# Patient Record
Sex: Male | Born: 1935 | Race: Black or African American | Hispanic: No | Marital: Married
Health system: Southern US, Community
[De-identification: ages and names within clinical notes are randomized; demographics above are authoritative.]

---

## 2003-04-05 ENCOUNTER — Emergency Department (HOSPITAL_COMMUNITY): Admission: EM | Admit: 2003-04-05 | Discharge: 2003-04-05 | Payer: Self-pay | Admitting: Emergency Medicine

## 2004-05-13 ENCOUNTER — Ambulatory Visit: Payer: Self-pay | Admitting: Cardiology

## 2004-05-13 ENCOUNTER — Inpatient Hospital Stay (HOSPITAL_COMMUNITY): Admission: EM | Admit: 2004-05-13 | Discharge: 2004-05-14 | Payer: Self-pay | Admitting: *Deleted

## 2004-05-13 ENCOUNTER — Ambulatory Visit: Payer: Self-pay | Admitting: Internal Medicine

## 2007-01-06 IMAGING — CT CT ANGIO CHEST
2 of 7 series · 16 of 30 positions shown · IV contrast (omnipaque)
Comparison: none

CLINICAL DATA: Chest pain and shortness of breath.
CHEST CT ANGIO WITH CONTRAST:
TECHNIQUE: Multidetector CT imaging of the chest was performed according to the protocol for detection of pulmonary embolism during IV bolus injection of 120 ml Omnipaque 300.  Coronal and sagittal plane reformatted images were also generated.  
Satisfactory opacification of the pulmonary arteries is seen, and there is no evidence of acute pulmonary embolism.  There is no evidence of thoracic aortic aneurysm or dissection.  
A low attenuation superior mediastinal mass is seen extending into the lower right neck likely originating from the thyroid gland and likely representing substernal extension of goiter.  This results in tracheal deviation to the left.  
There is no evidence of pleural or pericardial effusion.  Patchy pulmonary infiltrates are seen which are more severe in the lower lung zones.  There are also scattered small less than 1 cm pulmonary nodular densities in the right lung.  These findings may be due to pulmonary edema or an infectious or inflammatory process.

[Series 4: pe · axial · 0.68mm/px · z∈[-351,-98]mm · 14 of 473 slices shown]
[im 34/473  lung]
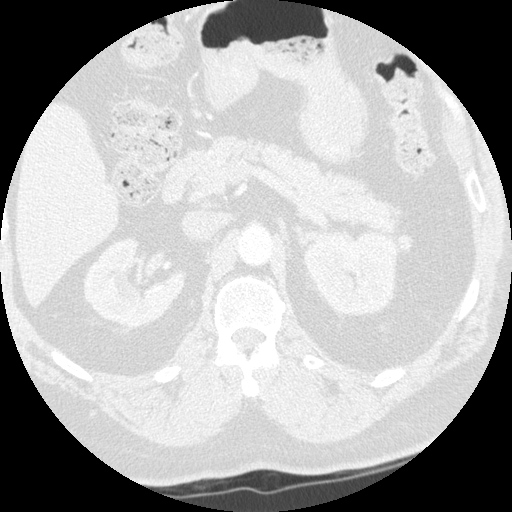
[im 68/473  mediastinal]
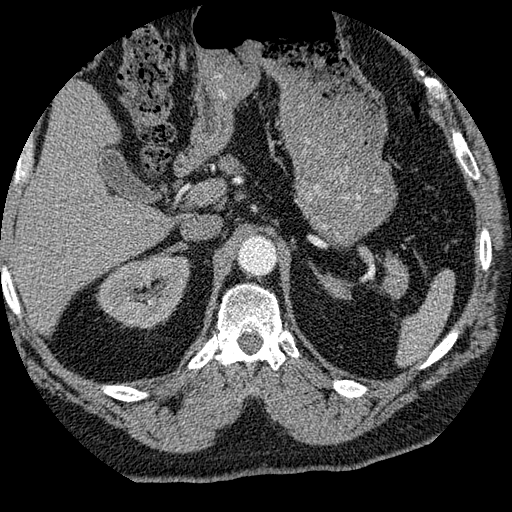
[im 102/473  lung]
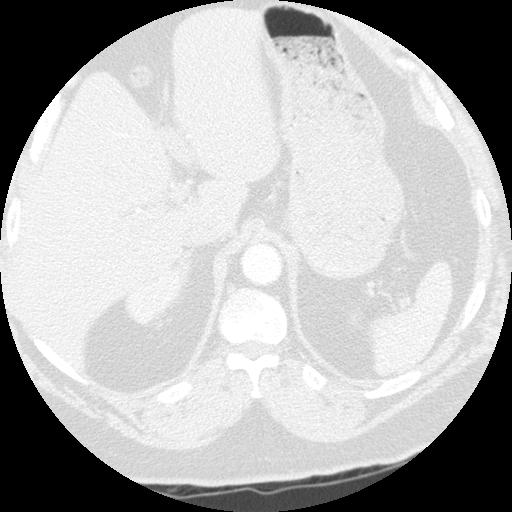
[im 135/473  mediastinal]
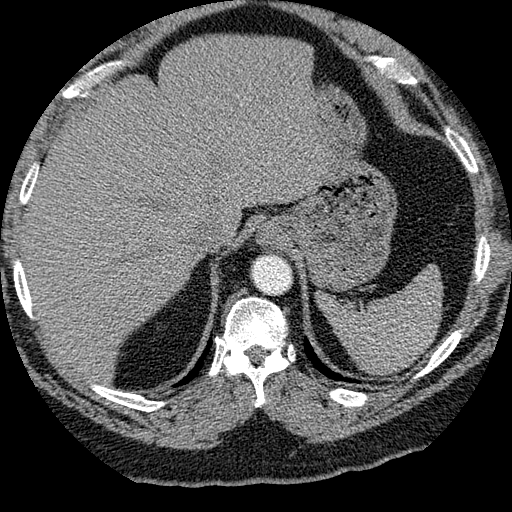
[im 169/473  lung]
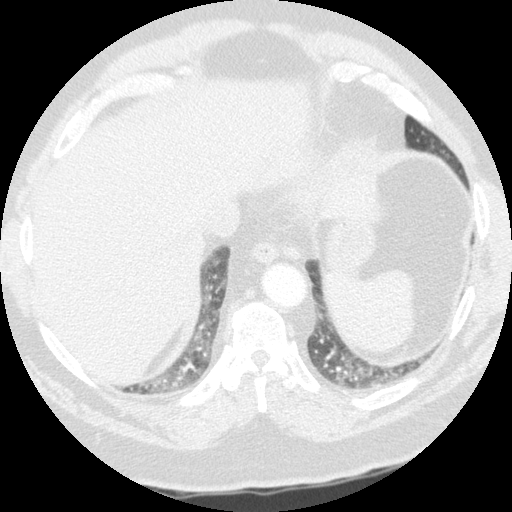
[im 203/473  mediastinal]
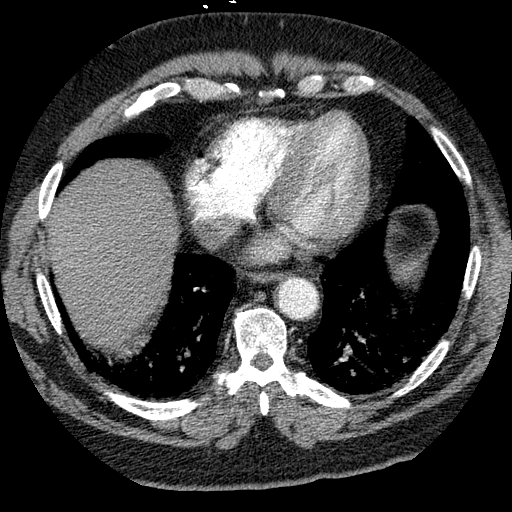
[im 237/473  lung]
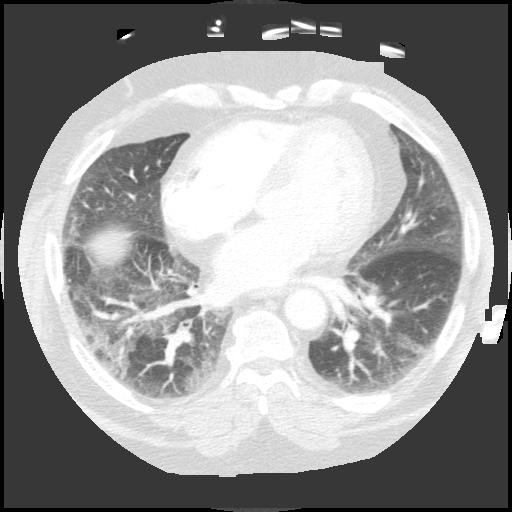
[im 246/473  mediastinal]
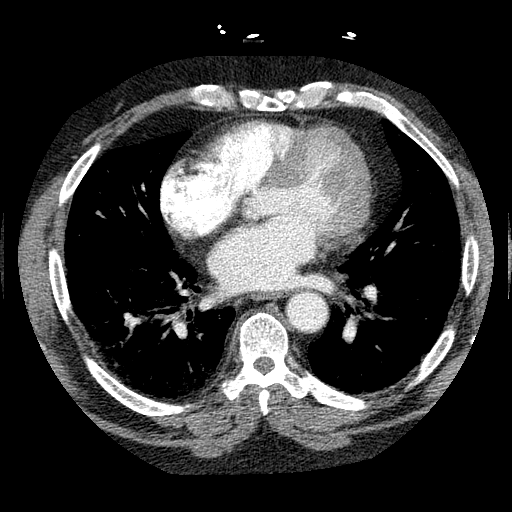
[im 270/473  lung]
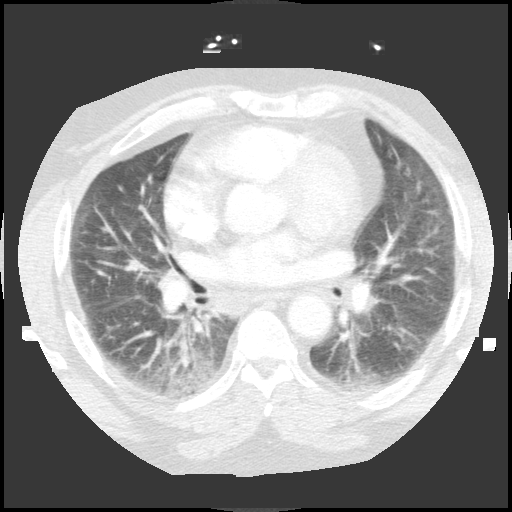
[im 304/473  mediastinal]
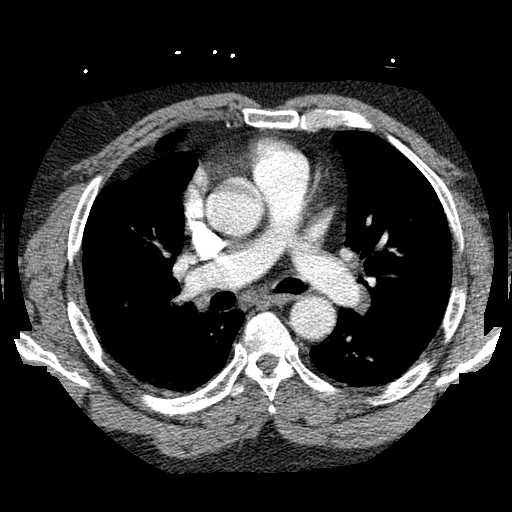
[im 338/473  lung]
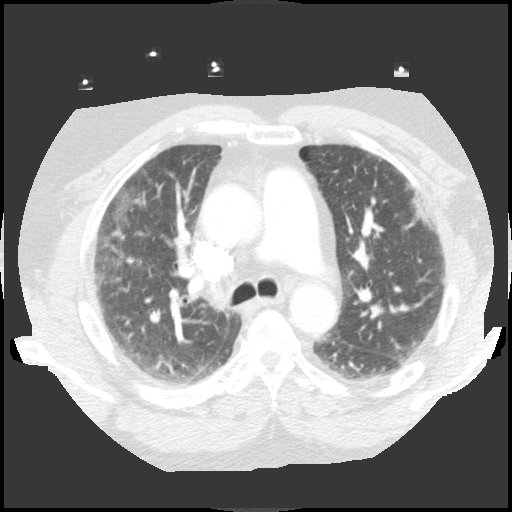
[im 371/473  mediastinal]
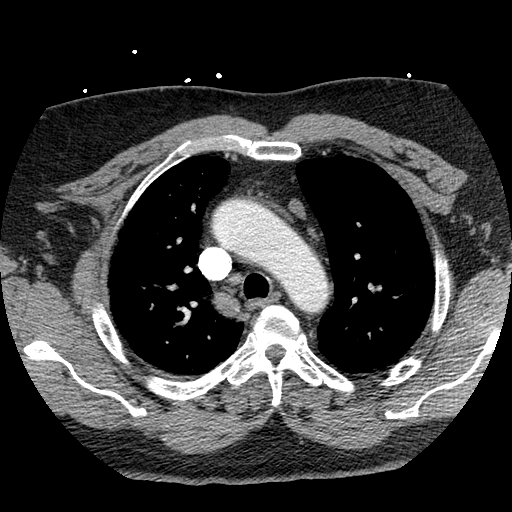
[im 405/473  lung]
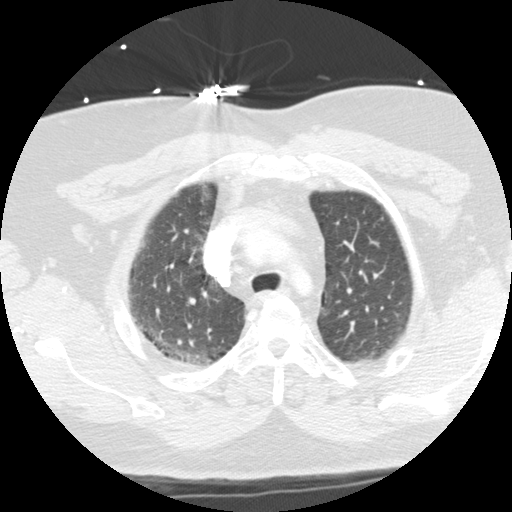
[im 439/473  mediastinal]
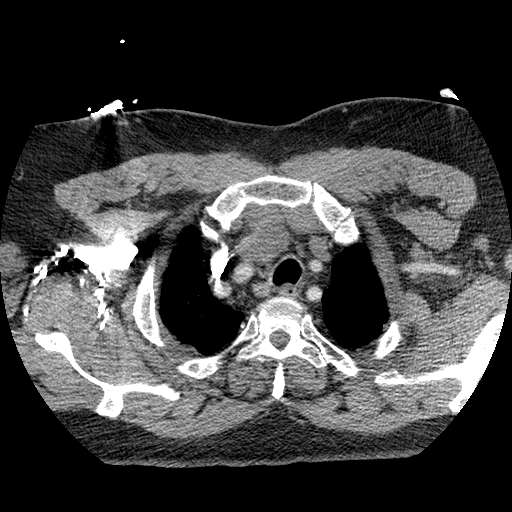

[Series 401: reformatted · coronal · 0.68mm/px · 2 of 106 slices shown]
[im 36/106  lung]
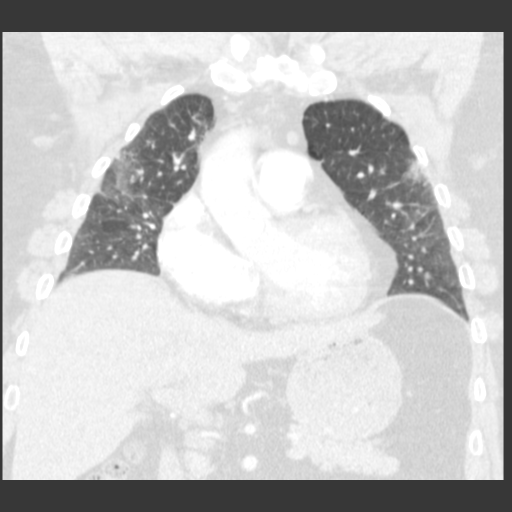
[im 71/106  lung]
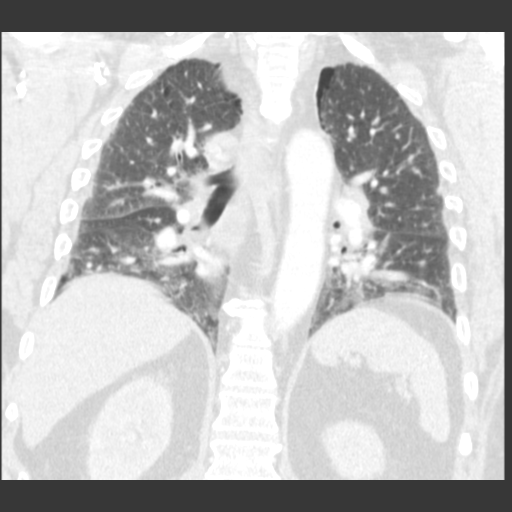

[16 of 30 positions shown; findings below may reference images not displayed]

IMPRESSION: 1.  No evidence of acute pulmonary embolism.
2.  Bilateral predominantly lower lung infiltrates and scattered tiny less than 1 cm right lung nodular densities.  These findings could be related to pulmonary edema or infectious or inflammatory etiologies; chest radiographic follow-up is recommended.
3.  Right superior mediastinal mass extending into the right neck, likely representing substernal extension of goiter.  Thyroid ultrasound is recommended for further evaluation.

## 2016-02-04 ENCOUNTER — Other Ambulatory Visit (HOSPITAL_COMMUNITY): Payer: Self-pay

## 2016-02-04 ENCOUNTER — Inpatient Hospital Stay
Admission: AD | Admit: 2016-02-04 | Discharge: 2016-02-28 | Disposition: A | Discharge status: Skilled Nursing Facility | Payer: Self-pay | Source: Other Acute Inpatient Hospital | Attending: Nephrology | Admitting: Nephrology

## 2016-02-04 DIAGNOSIS — J811 Chronic pulmonary edema: Secondary | ICD-10-CM

## 2016-02-04 DIAGNOSIS — J9690 Respiratory failure, unspecified, unspecified whether with hypoxia or hypercapnia: Secondary | ICD-10-CM

## 2016-02-04 LAB — COMPREHENSIVE METABOLIC PANEL
ALK PHOS: 77 U/L (ref 38–126)
ALT: 11 U/L — ABNORMAL LOW (ref 17–63)
ANION GAP: 9 (ref 5–15)
AST: 12 U/L — ABNORMAL LOW (ref 15–41)
Albumin: 3.4 g/dL — ABNORMAL LOW (ref 3.5–5.0)
BUN: 35 mg/dL — ABNORMAL HIGH (ref 6–20)
CALCIUM: 9.6 mg/dL (ref 8.9–10.3)
CO2: 25 mmol/L (ref 22–32)
Chloride: 103 mmol/L (ref 101–111)
Creatinine, Ser: 5.28 mg/dL — ABNORMAL HIGH (ref 0.61–1.24)
GFR, EST AFRICAN AMERICAN: 11 mL/min — AB (ref >60)
GFR, EST NON AFRICAN AMERICAN: 9 mL/min — AB (ref >60)
Glucose, Bld: 223 mg/dL — ABNORMAL HIGH (ref 65–99)
Potassium: 3.3 mmol/L — ABNORMAL LOW (ref 3.5–5.1)
SODIUM: 137 mmol/L (ref 135–145)
Total Bilirubin: 0.8 mg/dL (ref 0.3–1.2)
Total Protein: 6.4 g/dL — ABNORMAL LOW (ref 6.5–8.1)

## 2016-02-04 NOTE — Consult Note (Signed)
Date: 02/04/2016                  Patient Name:  Joshua HuxleyJames Macias  MRN: [id number]  DOB: [birth year]  Age / Sex: 80 y.o., male         PCP: No PCP Per Patient                 Service [facility]ing Consult[facility]                 Reason for Consult: ESRD            History of Present Illness: Patient is a 80 y.o. male with medical problems of and stated disease and hemodialysis for 3 years or so,chronic hypotension requiring midodrine, previous DVTs, history of GI bleed was admitted [facility] for shortness of breath., [facility] for ongoing management.   Patient is a lethargic and is able to provide only minimal information He states that his dialysis schedule Tuesday, Thursday, Saturday. Cannot name his dialysis unit or nephrologist He denies any shortness of breath According to discharge summary,he underwent a CT chest without contrast on November 4 which showed diffuse bilateral airspace disease likely due to pneumonia and also showed large stable pericardial effusion. Patient has known IJ DVTs. He could not get a PICC line due to this problem. He requires regular Midodrine for chronic hypotension.He has history of GI bleed therefore anticoagulation is avoided in this patient.Per chart, he had pulmonary edema due to missed hemodialysis He has chronic active fibrillation but no anticoagulation due to GI bleed.      Medications: Outpatient medications: No prescriptions prior to admission.    Current medications: No current facility-administered medications for this encounter.       Allergies: Allergies not on file    Past Medical History: No past medical history on file.   Past Surgical History: No past surgical history on file.   Family History: No family history on file.   Social History: Social History   Social History  . Marital status: Married    Spouse name:  N/A  . Number of children: N/A  . Years of education: N/A   Occupational History  . Not on file.   Social History Main Topics  . Smoking status: Not on file  . Smokeless tobacco: Not on file  . Alcohol use Not on file  . Drug use: Unknown  . Sexual activity: Not on file   Other Topics Concern  . Not on file   Social History Narrative  . No narrative on file     Review of Systems: limited due to patient lethargy Gen: no fevers, chills HEENT: denies vision or hearing problems CV: no chest pain or shortness of breath Resp: no cough, sputum GI:no nausea, vomiting GU : anuric MS: no complaint Derm:  no complaint Psych:no complaint Heme: no complaint Neuro: no complaint Endocrineno complaint  Vital Signs: There were no vitals taken for this visit.  No intake or output data in the 24 hours ending 02/04/16 1749  Weight trends: There were no vitals filed for this visit.  Physical Exam: General:  chronically ill-appearing gentleman, laying in the bed  HEENT Temporal muscle wasting, anicteric, moist oral mucous membranes  Neck:  supple  Lungs: Normal breathing effort, clear to auscultation  Heart:: irregular, atrial fibrillation  Abdomen: Soft, nontender, nondistended  Extremities:  no peripheral edema  Neurologic: Lethargic,  arousable, follows a few commands  Skin: No acute fracture  Access: IJ Permcath  Foley:        Lab results: Basic Metabolic Panel:  Recent Labs Lab 02/04/16 1510  NA 137  K 3.3*  CL 103  CO2 25  GLUCOSE 223*  BUN 35*  CREATININE 5.28*  CALCIUM 9.6    Liver Function Tests:  Recent Labs Lab 02/04/16 1510  AST 12*  ALT 11*  ALKPHOS 77  BILITOT 0.8  PROT 6.4*  ALBUMIN 3.4*   No results for input(s): LIPASE, AMYLASE in the last 168 hours. No results for input(s): AMMONIA in the last 168 hours.  CBC: No results for input(s): WBC, NEUTROABS, HGB, HCT, MCV, PLT in the last 168 hours.  Cardiac Enzymes: No results for  input(s): CKTOTAL, TROPONINI in the last 168 hours.  BNP: Invalid input(s): POCBNP  CBG: No results for input(s): GLUCAP in the last 168 hours.  Microbiology: No results found for this or any previous visit (from the past 720 hour(s)).   Coagulation Studies: No results for input(s): LABPROT, INR in the last 72 hours.  Urinalysis: No results for input(s): COLORURINE, LABSPEC, PHURINE, GLUCOSEU, HGBUR, BILIRUBINUR, KETONESUR, PROTEINUR, UROBILINOGEN, NITRITE, LEUKOCYTESUR in the last 72 hours.  Invalid input(s): APPERANCEUR      Imaging: Dg Chest Port 1 View  Result Date: 02/04/2016 CLINICAL DATA:  Pulmonary edema. EXAM: PORTABLE CHEST 1 VIEW COMPARISON:  May 13, 2004 FINDINGS: A left PermCath is identified in good position. Stable cardiomegaly. The hila and mediastinum are unchanged. Increased interstitial markings consistent with moderate pulmonary edema. No other acute abnormalities. IMPRESSION: Cardiomegaly and moderate pulmonary edema. Electronically Signed   By: Gerome Samavid  Williams III M.D   On: 02/04/2016 15:30      Assessment & Plan: Pt is a 80 y.o. AA  male with medical problems of and stated disease and hemodialysis for 3 years or so,chronic hypotension requiring midodrine, previous DVTs, history of GI bleed was admitted to [facility] for [facility]ness of [facility]., [facility] for ongoing management  1. End stage renal disease 2. Anemia of chronic kidney disease 3. Secondary hyperparathyroidism 4. Chronic hypotension 5. Pericardial effusion per CT scan from outside hospital  Plan: Hemodialysis TTS Schedule midodrine Aranesp if Hgb is low. Labs are pending at present minimal UF as patient appears clinically dry

## 2016-02-05 LAB — RENAL FUNCTION PANEL
Albumin: 3.3 g/dL — ABNORMAL LOW (ref 3.5–5.0)
Anion gap: 12 (ref 5–15)
BUN: 45 mg/dL — AB (ref 6–20)
CHLORIDE: 103 mmol/L (ref 101–111)
CO2: 24 mmol/L (ref 22–32)
CREATININE: 6.35 mg/dL — AB (ref 0.61–1.24)
Calcium: 9.7 mg/dL (ref 8.9–10.3)
GFR calc Af Amer: 9 mL/min — ABNORMAL LOW (ref >60)
GFR, EST NON AFRICAN AMERICAN: 7 mL/min — AB (ref >60)
GLUCOSE: 134 mg/dL — AB (ref 65–99)
Phosphorus: 4.3 mg/dL (ref 2.5–4.6)
Potassium: 4.4 mmol/L (ref 3.5–5.1)
Sodium: 139 mmol/L (ref 135–145)

## 2016-02-05 LAB — CBC WITH DIFFERENTIAL/PLATELET
Basophils Absolute: 0 10*3/uL (ref 0.0–0.1)
Basophils Relative: 0 %
EOS ABS: 0 10*3/uL (ref 0.0–0.7)
EOS PCT: 0 %
HCT: 29.9 % — ABNORMAL LOW (ref 39.0–52.0)
Hemoglobin: 8.9 g/dL — ABNORMAL LOW (ref 13.0–17.0)
LYMPHS ABS: 1.1 10*3/uL (ref 0.7–4.0)
Lymphocytes Relative: 8 %
MCH: 25.8 pg — AB (ref 26.0–34.0)
MCHC: 29.8 g/dL — ABNORMAL LOW (ref 30.0–36.0)
MCV: 86.7 fL (ref 78.0–100.0)
Monocytes Absolute: 0.5 10*3/uL (ref 0.1–1.0)
Monocytes Relative: 4 %
Neutro Abs: 12.3 10*3/uL — ABNORMAL HIGH (ref 1.7–7.7)
Neutrophils Relative %: 88 %
PLATELETS: 264 10*3/uL (ref 150–400)
RBC: 3.45 MIL/uL — AB (ref 4.22–5.81)
RDW: 18.7 % — AB (ref 11.5–15.5)
WBC: 14 10*3/uL — AB (ref 4.0–10.5)

## 2016-02-05 LAB — VANCOMYCIN, TROUGH: VANCOMYCIN TR: 20 ug/mL (ref 15–20)

## 2016-02-06 LAB — BASIC METABOLIC PANEL
ANION GAP: 12 (ref 5–15)
BUN: 68 mg/dL — ABNORMAL HIGH (ref 6–20)
CALCIUM: 9.9 mg/dL (ref 8.9–10.3)
CO2: 22 mmol/L (ref 22–32)
Chloride: 102 mmol/L (ref 101–111)
Creatinine, Ser: 8.03 mg/dL — ABNORMAL HIGH (ref 0.61–1.24)
GFR, EST AFRICAN AMERICAN: 7 mL/min — AB (ref >60)
GFR, EST NON AFRICAN AMERICAN: 6 mL/min — AB (ref >60)
Glucose, Bld: 213 mg/dL — ABNORMAL HIGH (ref 65–99)
Potassium: 4.8 mmol/L (ref 3.5–5.1)
Sodium: 136 mmol/L (ref 135–145)

## 2016-02-06 LAB — CBC
HEMATOCRIT: 29.3 % — AB (ref 39.0–52.0)
Hemoglobin: 8.9 g/dL — ABNORMAL LOW (ref 13.0–17.0)
MCH: 26 pg (ref 26.0–34.0)
MCHC: 30.4 g/dL (ref 30.0–36.0)
MCV: 85.7 fL (ref 78.0–100.0)
PLATELETS: 256 10*3/uL (ref 150–400)
RBC: 3.42 MIL/uL — ABNORMAL LOW (ref 4.22–5.81)
RDW: 19 % — AB (ref 11.5–15.5)
WBC: 17.5 10*3/uL — AB (ref 4.0–10.5)

## 2016-02-06 LAB — HEMOGLOBIN A1C
Hgb A1c MFr Bld: 4.9 % (ref 4.8–5.6)
MEAN PLASMA GLUCOSE: 94 mg/dL

## 2016-02-07 LAB — HEPATITIS B CORE ANTIBODY, TOTAL: Hep B Core Total Ab: POSITIVE — AB

## 2016-02-07 LAB — HEPATITIS B SURFACE ANTIBODY,QUALITATIVE: HEP B S AB: REACTIVE

## 2016-02-07 LAB — HEPATITIS B SURFACE ANTIGEN: HEP B S AG: NEGATIVE

## 2016-02-07 NOTE — Progress Notes (Signed)
  [facility]  ROUNDING NOTE   Subjective:  Patient resting comfortably in bed. Patient had dialysis on Sunday. He is due for dialysis again tomorrow.   Objective:  Vital signs in last 24 hours:  Temperature 97.9 pulse 87 respirations 12 blood pressure 102/57  Physical Exam: General: Chronically ill-appearing  Head: Temporal wasting  Eyes: Anicteric  Neck: Supple, trachea midline  Lungs:  Clear to auscultation, normal effort  Heart: S1S2 irregular  Abdomen:  Soft, nontender, bowel sounds present  Extremities: No peripheral edema.  Neurologic: Nonfocal, moving all four extremities  Skin: No lesions  Access: IJ PermCath    Basic Metabolic Panel:  Recent Labs Lab 02/04/16 1510 02/05/16 0715 02/06/16 0640  NA 137 139 136  K 3.3* 4.4 4.8  CL 103 103 102  CO2 25 24 22   GLUCOSE 223* 134* 213*  BUN 35* 45* 68*  CREATININE 5.28* 6.35* 8.03*  CALCIUM 9.6 9.7 9.9  PHOS  --  4.3  --     Liver Function Tests:  Recent Labs Lab 02/04/16 1510 02/05/16 0715  AST 12*  --   ALT 11*  --   ALKPHOS 77  --   BILITOT 0.8  --   PROT 6.4*  --   ALBUMIN 3.4* 3.3*   No results for input(s): LIPASE, AMYLASE in the last 168 hours. No results for input(s): AMMONIA in the last 168 hours.  CBC:  Recent Labs Lab 02/05/16 0715 02/06/16 0640  WBC 14.0* 17.5*  NEUTROABS 12.3*  --   HGB 8.9* 8.9*  HCT 29.9* 29.3*  MCV 86.7 85.7  PLT 264 256    Cardiac Enzymes: No results for input(s): CKTOTAL, CKMB, CKMBINDEX, TROPONINI in the last 168 hours.  BNP: Invalid input(s): POCBNP  CBG: No results for input(s): GLUCAP in the last 168 hours.  Microbiology: No results found for this or any previous visit.  Coagulation Studies: No results for input(s): LABPROT, INR in the last 72 hours.  Urinalysis: No results for input(s): COLORURINE, LABSPEC, PHURINE, GLUCOSEU, HGBUR, BILIRUBINUR, KETONESUR, PROTEINUR, UROBILINOGEN, NITRITE, LEUKOCYTESUR in the last 72  hours.  Invalid input(s): APPERANCEUR    Imaging: No results found.   Medications:       Assessment/ Plan:  80 y.o. male  with medical problems of and stated disease and hemodialysis for 3 years,chronic hypotension requiring midodrine, previous DVTs, history of GI bleed was [facility]., [facility] ongoing management  1. End stage renal disease on dialysis TTS: Patient had dialysis on Sunday. He will be scheduled to have hemodialysis again tomorrow. Ultrafiltration target 1.5 kg.  2. Anemia of chronic kidney disease:  Hemoglobin 8.9. Start Aranesp per pharmacy protocol.  3. Secondary hyperparathyroidism:  Phosphorus 4.3 at last check. Continue sevelamer.    4. Chronic hypotension:  Continue midodrine to maintain BP.   5. Pericardial effusion per CT scan from outside hospital: management per hospitalist.    LOS: 0 Myrel Rappleye 11/13/20173:43 PM

## 2016-02-08 LAB — RENAL FUNCTION PANEL
ALBUMIN: 2.8 g/dL — AB (ref 3.5–5.0)
Anion gap: 12 (ref 5–15)
BUN: 72 mg/dL — AB (ref 6–20)
CALCIUM: 9.6 mg/dL (ref 8.9–10.3)
CO2: 26 mmol/L (ref 22–32)
Chloride: 95 mmol/L — ABNORMAL LOW (ref 101–111)
Creatinine, Ser: 7.66 mg/dL — ABNORMAL HIGH (ref 0.61–1.24)
GFR calc Af Amer: 7 mL/min — ABNORMAL LOW (ref >60)
GFR, EST NON AFRICAN AMERICAN: 6 mL/min — AB (ref >60)
GLUCOSE: 175 mg/dL — AB (ref 65–99)
PHOSPHORUS: 5.2 mg/dL — AB (ref 2.5–4.6)
POTASSIUM: 4.5 mmol/L (ref 3.5–5.1)
SODIUM: 133 mmol/L — AB (ref 135–145)

## 2016-02-08 LAB — CBC
HCT: 27.9 % — ABNORMAL LOW (ref 39.0–52.0)
Hemoglobin: 8.7 g/dL — ABNORMAL LOW (ref 13.0–17.0)
MCH: 26.5 pg (ref 26.0–34.0)
MCHC: 31.2 g/dL (ref 30.0–36.0)
MCV: 85.1 fL (ref 78.0–100.0)
Platelets: ADEQUATE 10*3/uL (ref 150–400)
RBC: 3.28 MIL/uL — AB (ref 4.22–5.81)
RDW: 19.5 % — AB (ref 11.5–15.5)
WBC: 21.7 10*3/uL — ABNORMAL HIGH (ref 4.0–10.5)

## 2016-02-08 LAB — VANCOMYCIN, RANDOM: Vancomycin Rm: 18

## 2016-02-09 LAB — RENAL FUNCTION PANEL
ALBUMIN: 2.8 g/dL — AB (ref 3.5–5.0)
ANION GAP: 14 (ref 5–15)
BUN: 48 mg/dL — ABNORMAL HIGH (ref 6–20)
CALCIUM: 9.3 mg/dL (ref 8.9–10.3)
CO2: 24 mmol/L (ref 22–32)
Chloride: 98 mmol/L — ABNORMAL LOW (ref 101–111)
Creatinine, Ser: 5.68 mg/dL — ABNORMAL HIGH (ref 0.61–1.24)
GFR, EST AFRICAN AMERICAN: 10 mL/min — AB (ref >60)
GFR, EST NON AFRICAN AMERICAN: 9 mL/min — AB (ref >60)
Glucose, Bld: 124 mg/dL — ABNORMAL HIGH (ref 65–99)
PHOSPHORUS: 3.4 mg/dL (ref 2.5–4.6)
Potassium: 4.3 mmol/L (ref 3.5–5.1)
SODIUM: 136 mmol/L (ref 135–145)

## 2016-02-09 LAB — CBC
HCT: 29.8 % — ABNORMAL LOW (ref 39.0–52.0)
HEMOGLOBIN: 9.1 g/dL — AB (ref 13.0–17.0)
MCH: 25.9 pg — AB (ref 26.0–34.0)
MCHC: 30.5 g/dL (ref 30.0–36.0)
MCV: 84.7 fL (ref 78.0–100.0)
Platelets: 154 10*3/uL (ref 150–400)
RBC: 3.52 MIL/uL — AB (ref 4.22–5.81)
RDW: 19.8 % — ABNORMAL HIGH (ref 11.5–15.5)
WBC: 19.8 10*3/uL — ABNORMAL HIGH (ref 4.0–10.5)

## 2016-02-09 NOTE — Progress Notes (Signed)
[facility]  ROUNDING NOTE   Subjective:  Patient seen at bedside. He tolerated hemodialysis well yesterday. Reports that he is a bit of a headache today.   Objective:  Vital signs in last 24 hours:  Temperature 97.0 pulse 99 respirations 16 blood pressure 88/50  Physical Exam: General: Chronically ill-appearing  Head: Temporal wasting  Eyes: Anicteric  Neck: Supple, trachea midline  Lungs:  Clear to auscultation, normal effort  Heart: S1S2 irregular  Abdomen:  Soft, nontender, bowel sounds present  Extremities: No peripheral edema.  Neurologic: Nonfocal, moving all four extremities  Skin: No lesions  Access: IJ PermCath    Basic Metabolic Panel:  Recent Labs Lab 02/04/16 1510 02/05/16 0715 02/06/16 0640 02/08/16 1135 02/09/16 0621  NA 137 139 136 133* 136  K 3.3* 4.4 4.8 4.5 4.3  CL 103 103 102 95* 98*  CO2 25 24 22 26 24   GLUCOSE 223* 134* 213* 175* 124*  BUN 35* 45* 68* 72* 48*  CREATININE 5.28* 6.35* 8.03* 7.66* 5.68*  CALCIUM 9.6 9.7 9.9 9.6 9.3  PHOS  --  4.3  --  5.2* 3.4    Liver Function Tests:  Recent Labs Lab 02/04/16 1510 02/05/16 0715 02/08/16 1135 02/09/16 0621  AST 12*  --   --   --   ALT 11*  --   --   --   ALKPHOS 77  --   --   --   BILITOT 0.8  --   --   --   PROT 6.4*  --   --   --   ALBUMIN 3.4* 3.3* 2.8* 2.8*   No results for input(s): LIPASE, AMYLASE in the last 168 hours. No results for input(s): AMMONIA in the last 168 hours.  CBC:  Recent Labs Lab 02/05/16 0715 02/06/16 0640 02/08/16 1032 02/09/16 0621  WBC 14.0* 17.5* 21.7* 19.8*  NEUTROABS 12.3*  --   --   --   HGB 8.9* 8.9* 8.7* 9.1*  HCT 29.9* 29.3* 27.9* 29.8*  MCV 86.7 85.7 85.1 84.7  PLT 264 256 PLATELET CLUMPS NOTED ON SMEAR, COUNT APPEARS ADEQUATE 154    Cardiac Enzymes: No results for input(s): CKTOTAL, CKMB, CKMBINDEX, TROPONINI in the last 168 hours.  BNP: Invalid input(s): POCBNP  CBG: No results for input(s): GLUCAP in the last  168 hours.  Microbiology: No results found for this or any previous visit.  Coagulation Studies: No results for input(s): LABPROT, INR in the last 72 hours.  Urinalysis: No results for input(s): COLORURINE, LABSPEC, PHURINE, GLUCOSEU, HGBUR, BILIRUBINUR, KETONESUR, PROTEINUR, UROBILINOGEN, NITRITE, LEUKOCYTESUR in the last 72 hours.  Invalid input(s): APPERANCEUR    Imaging: No results found.   Medications:       Assessment/ Plan:  80 y.o. male  with medical problems of and stated disease and hemodialysis for 3 years,chronic hypotension requiring midodrine, previous DVTs, history of GI bleed was [facility]., [facility]  1. End stage renal disease on dialysis TTS: We will plan for hemodialysis 4. Orders to be prepared.  2. Anemia of chronic kidney disease:  Hemoglobin up to 9.1. Continue Aranesp per pharmacy protocol.  3. Secondary hyperparathyroidism: Phosphorus down to 3.4. Continue sevelamer.  4. Chronic hypotension:  Blood pressure today was 88/50. Continue midodrine to maintain BP.   5. Pericardial effusion per CT scan from outside hospital: management per hospitalist.    LOS: 0 Rhyan Wolters  11/15/20175:14 PM

## 2016-02-10 ENCOUNTER — Other Ambulatory Visit (HOSPITAL_BASED_OUTPATIENT_CLINIC_OR_DEPARTMENT_OTHER): Payer: Self-pay

## 2016-02-10 DIAGNOSIS — I319 Disease of pericardium, unspecified: Secondary | ICD-10-CM

## 2016-02-10 LAB — RENAL FUNCTION PANEL
ALBUMIN: 2.6 g/dL — AB (ref 3.5–5.0)
ANION GAP: 10 (ref 5–15)
BUN: 68 mg/dL — AB (ref 6–20)
CO2: 26 mmol/L (ref 22–32)
Calcium: 9.1 mg/dL (ref 8.9–10.3)
Chloride: 97 mmol/L — ABNORMAL LOW (ref 101–111)
Creatinine, Ser: 7.07 mg/dL — ABNORMAL HIGH (ref 0.61–1.24)
GFR, EST AFRICAN AMERICAN: 8 mL/min — AB (ref >60)
GFR, EST NON AFRICAN AMERICAN: 7 mL/min — AB (ref >60)
Glucose, Bld: 115 mg/dL — ABNORMAL HIGH (ref 65–99)
PHOSPHORUS: 4.8 mg/dL — AB (ref 2.5–4.6)
POTASSIUM: 4.9 mmol/L (ref 3.5–5.1)
Sodium: 133 mmol/L — ABNORMAL LOW (ref 135–145)

## 2016-02-10 LAB — ECHOCARDIOGRAM COMPLETE
AVLVOTPG: 4 mmHg
FS: 38 % (ref 28–44)
IVS/LV PW RATIO, ED: 0.91
LA diam end sys: 50 mm
LA vol A4C: 61.5 ml
LASIZE: 50 mm
LAVOL: 90.2 mL
LVOT SV: 55 mL
LVOT VTI: 17.6 cm
LVOT area: 3.14 cm2
LVOT peak vel: 99.8 cm/s
LVOTD: 20 mm
PW: 11 mm — AB (ref 0.6–1.1)
RV LATERAL S' VELOCITY: 10.7 cm/s
RV TAPSE: 26.5 mm
RV sys press: 41 mmHg
Reg peak vel: 286 cm/s
TR max vel: 286 cm/s

## 2016-02-10 LAB — CBC
HEMATOCRIT: 27.3 % — AB (ref 39.0–52.0)
HEMOGLOBIN: 8.5 g/dL — AB (ref 13.0–17.0)
MCH: 26.2 pg (ref 26.0–34.0)
MCHC: 31.1 g/dL (ref 30.0–36.0)
MCV: 84 fL (ref 78.0–100.0)
Platelets: 153 10*3/uL (ref 150–400)
RBC: 3.25 MIL/uL — AB (ref 4.22–5.81)
RDW: 19.1 % — AB (ref 11.5–15.5)
WBC: 15.4 10*3/uL — AB (ref 4.0–10.5)

## 2016-02-10 LAB — VANCOMYCIN, TROUGH: Vancomycin Tr: 13 ug/mL — ABNORMAL LOW (ref 15–20)

## 2016-02-11 NOTE — Progress Notes (Signed)
[facility]  ROUNDING NOTE   Subjective:  Patient had hemodialysis yesterday. He tolerated this well. He will be due for dialysis again tomorrow.   Objective:  Vital signs in last 24 hours:  Temperature 98.7 pulse 81 respirations 19 blood pressure 100/64.  Physical Exam: General: Chronically ill-appearing  Head: Temporal wasting  Eyes: Anicteric  Neck: Supple, trachea midline  Lungs:  Clear to auscultation, normal effort  Heart: S1S2 irregular  Abdomen:  Soft, nontender, bowel sounds present  Extremities: No peripheral edema.  Neurologic: Nonfocal, moving all four extremities  Skin: No lesions  Access: IJ PermCath    Basic Metabolic Panel:  Recent Labs Lab 02/05/16 0715 02/06/16 0640 02/08/16 1135 02/09/16 0621 02/10/16 0500  NA 139 136 133* 136 133*  K 4.4 4.8 4.5 4.3 4.9  CL 103 102 95* 98* 97*  CO2 24 22 26 24 26   GLUCOSE 134* 213* 175* 124* 115*  BUN 45* 68* 72* 48* 68*  CREATININE 6.35* 8.03* 7.66* 5.68* 7.07*  CALCIUM 9.7 9.9 9.6 9.3 9.1  PHOS 4.3  --  5.2* 3.4 4.8*    Liver Function Tests:  Recent Labs Lab 02/05/16 0715 02/08/16 1135 02/09/16 0621 02/10/16 0500  ALBUMIN 3.3* 2.8* 2.8* 2.6*   No results for input(s): LIPASE, AMYLASE in the last 168 hours. No results for input(s): AMMONIA in the last 168 hours.  CBC:  Recent Labs Lab 02/05/16 0715 02/06/16 0640 02/08/16 1032 02/09/16 0621 02/10/16 0724  WBC 14.0* 17.5* 21.7* 19.8* 15.4*  NEUTROABS 12.3*  --   --   --   --   HGB 8.9* 8.9* 8.7* 9.1* 8.5*  HCT 29.9* 29.3* 27.9* 29.8* 27.3*  MCV 86.7 85.7 85.1 84.7 84.0  PLT 264 256 PLATELET CLUMPS NOTED ON SMEAR, COUNT APPEARS ADEQUATE 154 153    Cardiac Enzymes: No results for input(s): CKTOTAL, CKMB, CKMBINDEX, TROPONINI in the last 168 hours.  BNP: Invalid input(s): POCBNP  CBG: No results for input(s): GLUCAP in the last 168 hours.  Microbiology: Results for orders placed or performed during the hospital  encounter of 02/04/16  Culture, blood (routine x 2)     Status: None (Preliminary result)   Collection Time: 02/10/16  8:40 AM  Result Value Ref Range Status   Specimen Description HEMODIALYSIS LINE  Final   Special Requests BOTTLES DRAWN AEROBIC AND ANAEROBIC 5CC  Final   Culture NO GROWTH 1 DAY  Final   Report Status PENDING  Incomplete  Culture, blood (routine x 2)     Status: None (Preliminary result)   Collection Time: 02/10/16 10:47 AM  Result Value Ref Range Status   Specimen Description BLOOD LEFT ARM  Final   Special Requests BOTTLES DRAWN AEROBIC AND ANAEROBIC 5CC  Final   Culture NO GROWTH 1 DAY  Final   Report Status PENDING  Incomplete    Coagulation Studies: No results for input(s): LABPROT, INR in the last 72 hours.  Urinalysis: No results for input(s): COLORURINE, LABSPEC, PHURINE, GLUCOSEU, HGBUR, BILIRUBINUR, KETONESUR, PROTEINUR, UROBILINOGEN, NITRITE, LEUKOCYTESUR in the last 72 hours.  Invalid input(s): APPERANCEUR    Imaging: No results found.   Medications:       Assessment/ Plan:  80 y.o. male  with medical problems of and stated disease and hemodialysis for 3 years,chronic hypotension requiring midodrine, previous DVTs, history of GI bleed was [facility]., [facility]  1. End stage  renal disease on dialysis TTS: no acute indication for dialysis today. We will prepare dialysis orders for tomorrow.  2. Anemia of chronic kidney disease:  Hemoglobin currently 8.5. Continue Aranesp 30mcg [place] weekly.   3. Secondary hyperparathyroidism: Phosphorus 4.8. Continuesevelamer 2400 mg by mouth 3 times a day with meals.  4. Chronic hypotension:  Continue midodrine10 mg by mouth 3 times a day.  5. Pericardial effusion per CT scan from outside hospital: management per hospitalist.    LOS: 0 Laurey Salser 11/17/20174:54  PM

## 2016-02-12 LAB — RENAL FUNCTION PANEL
ALBUMIN: 2.8 g/dL — AB (ref 3.5–5.0)
ANION GAP: 12 (ref 5–15)
BUN: 76 mg/dL — ABNORMAL HIGH (ref 6–20)
CALCIUM: 9.3 mg/dL (ref 8.9–10.3)
CO2: 24 mmol/L (ref 22–32)
Chloride: 99 mmol/L — ABNORMAL LOW (ref 101–111)
Creatinine, Ser: 7.32 mg/dL — ABNORMAL HIGH (ref 0.61–1.24)
GFR, EST AFRICAN AMERICAN: 7 mL/min — AB (ref >60)
GFR, EST NON AFRICAN AMERICAN: 6 mL/min — AB (ref >60)
Glucose, Bld: 146 mg/dL — ABNORMAL HIGH (ref 65–99)
PHOSPHORUS: 5.5 mg/dL — AB (ref 2.5–4.6)
Potassium: 5.4 mmol/L — ABNORMAL HIGH (ref 3.5–5.1)
SODIUM: 135 mmol/L (ref 135–145)

## 2016-02-12 LAB — CBC
HEMATOCRIT: 29.6 % — AB (ref 39.0–52.0)
HEMOGLOBIN: 9.1 g/dL — AB (ref 13.0–17.0)
MCH: 26.1 pg (ref 26.0–34.0)
MCHC: 30.7 g/dL (ref 30.0–36.0)
MCV: 85.1 fL (ref 78.0–100.0)
Platelets: 158 10*3/uL (ref 150–400)
RBC: 3.48 MIL/uL — AB (ref 4.22–5.81)
RDW: 19.1 % — ABNORMAL HIGH (ref 11.5–15.5)
WBC: 13.7 10*3/uL — ABNORMAL HIGH (ref 4.0–10.5)

## 2016-02-12 LAB — VANCOMYCIN, TROUGH: VANCOMYCIN TR: 27 ug/mL — AB (ref 15–20)

## 2016-02-14 NOTE — Progress Notes (Signed)
[facility]  ROUNDING NOTE   Subjective:  Patient seen and evaluated during hemodialysis. He appears to be tolerating this well. Patient resting comfortably in bed.   Objective:  Vital signs in last 24 hours:  Temperature 90.8 pulse 102 respirations 20 blood pressure 108/80  Physical Exam: General: Chronically ill-appearing  Head: Temporal wasting  Eyes: Anicteric  Neck: Supple, trachea midline  Lungs:  Clear to auscultation, normal effort  Heart: S1S2 irregular  Abdomen:  Soft, nontender, bowel sounds present  Extremities: No peripheral edema.  Neurologic: Nonfocal, moving all four extremities  Skin: No lesions  Access: IJ PermCath    Basic Metabolic Panel:  Recent Labs Lab 02/08/16 1135 02/09/16 0621 02/10/16 0500 02/12/16 1243  NA 133* 136 133* 135  K 4.5 4.3 4.9 5.4*  CL 95* 98* 97* 99*  CO2 26 24 26 24   GLUCOSE 175* 124* 115* 146*  BUN 72* 48* 68* 76*  CREATININE 7.66* 5.68* 7.07* 7.32*  CALCIUM 9.6 9.3 9.1 9.3  PHOS 5.2* 3.4 4.8* 5.5*    Liver Function Tests:  Recent Labs Lab 02/08/16 1135 02/09/16 0621 02/10/16 0500 02/12/16 1243  ALBUMIN 2.8* 2.8* 2.6* 2.8*   No results for input(s): LIPASE, AMYLASE in the last 168 hours. No results for input(s): AMMONIA in the last 168 hours.  CBC:  Recent Labs Lab 02/08/16 1032 02/09/16 0621 02/10/16 0724 02/12/16 1243  WBC 21.7* 19.8* 15.4* 13.7*  HGB 8.7* 9.1* 8.5* 9.1*  HCT 27.9* 29.8* 27.3* 29.6*  MCV 85.1 84.7 84.0 85.1  PLT PLATELET CLUMPS NOTED ON SMEAR, COUNT APPEARS ADEQUATE 154 153 158    Cardiac Enzymes: No results for input(s): CKTOTAL, CKMB, CKMBINDEX, TROPONINI in the last 168 hours.  BNP: Invalid input(s): POCBNP  CBG: No results for input(s): GLUCAP in the last 168 hours.  Microbiology: Results for orders placed or performed during the hospital encounter of 02/04/16  Culture, blood (routine x 2)     Status: None (Preliminary result)   Collection Time:  02/10/16  8:40 AM  Result Value Ref Range Status   Specimen Description HEMODIALYSIS LINE  Final   Special Requests BOTTLES DRAWN AEROBIC AND ANAEROBIC 5CC  Final   Culture NO GROWTH 4 DAYS  Final   Report Status PENDING  Incomplete  Culture, blood (routine x 2)     Status: None (Preliminary result)   Collection Time: 02/10/16 10:47 AM  Result Value Ref Range Status   Specimen Description BLOOD LEFT ARM  Final   Special Requests BOTTLES DRAWN AEROBIC AND ANAEROBIC 5CC  Final   Culture NO GROWTH 4 DAYS  Final   Report Status PENDING  Incomplete    Coagulation Studies: No results for input(s): LABPROT, INR in the last 72 hours.  Urinalysis: No results for input(s): COLORURINE, LABSPEC, PHURINE, GLUCOSEU, HGBUR, BILIRUBINUR, KETONESUR, PROTEINUR, UROBILINOGEN, NITRITE, LEUKOCYTESUR in the last 72 hours.  Invalid input(s): APPERANCEUR    Imaging: No results found.   Medications:       Assessment/ Plan:  80 y.o. male  with medical problems of and stated disease and hemodialysis for 3 years,chronic hypotension requiring midodrine, previous DVTs, history of GI bleed was [facility]., [facility]  1. End stage renal disease on dialysis TTS: given Thanksgiving this week the patient is being doneearly. Patient seen evaluated during hemodialysis today. We'll plan for dialysis againon Wednesday.  2. Anemia of chronic kidney disease:  Hemoglobin up to 9.1. Continue Aranesp.  3. Secondary hyperparathyroidism: Phosphorus up to 5.5 over the weekend but still acceptable.. Continue sevelamer 2400 mg by mouth 3 times a day with meals.  4. Chronic hypotension:  Blood pressure 108/80 during dialysis.Continue midodrine10 mg by mouth 3 times a day.  5. Pericardial effusion per CT scan from outside hospital: management per hospitalist.    LOS: 0 Kenidy Crossland,  Kiriana Worthington 11/20/20174:51 PM

## 2016-02-15 LAB — CULTURE, BLOOD (ROUTINE X 2)
Culture: NO GROWTH
Culture: NO GROWTH

## 2016-02-16 LAB — CBC
HCT: 30.8 % — ABNORMAL LOW (ref 39.0–52.0)
Hemoglobin: 9.3 g/dL — ABNORMAL LOW (ref 13.0–17.0)
MCH: 26.4 pg (ref 26.0–34.0)
MCHC: 30.2 g/dL (ref 30.0–36.0)
MCV: 87.5 fL (ref 78.0–100.0)
PLATELETS: 223 10*3/uL (ref 150–400)
RBC: 3.52 MIL/uL — AB (ref 4.22–5.81)
RDW: 20.4 % — AB (ref 11.5–15.5)
WBC: 16.4 10*3/uL — AB (ref 4.0–10.5)

## 2016-02-16 LAB — RENAL FUNCTION PANEL
ALBUMIN: 2.9 g/dL — AB (ref 3.5–5.0)
Anion gap: 11 (ref 5–15)
BUN: 59 mg/dL — AB (ref 6–20)
CALCIUM: 8.8 mg/dL — AB (ref 8.9–10.3)
CHLORIDE: 101 mmol/L (ref 101–111)
CO2: 26 mmol/L (ref 22–32)
CREATININE: 7.12 mg/dL — AB (ref 0.61–1.24)
GFR calc Af Amer: 8 mL/min — ABNORMAL LOW (ref >60)
GFR, EST NON AFRICAN AMERICAN: 7 mL/min — AB (ref >60)
Glucose, Bld: 115 mg/dL — ABNORMAL HIGH (ref 65–99)
PHOSPHORUS: 5.6 mg/dL — AB (ref 2.5–4.6)
Potassium: 5.8 mmol/L — ABNORMAL HIGH (ref 3.5–5.1)
SODIUM: 138 mmol/L (ref 135–145)

## 2016-02-16 NOTE — Progress Notes (Signed)
[facility]  ROUNDING NOTE   Subjective:  Patient seen and evaluated at bedside. He is seen and evaluated during dialysis. He appears to be tolerating this well.   Objective:  Vital signs in last 24 hours:  Temperature 97.9 pulse 84 respirations 16 blood pressure 94/60  Physical Exam: General: Chronically ill-appearing  Head: Temporal wasting  Eyes: Anicteric  Neck: Supple, trachea midline  Lungs:  Clear to auscultation, normal effort  Heart: S1S2 irregular  Abdomen:  Soft, nontender, bowel sounds present  Extremities: No peripheral edema.  Neurologic: Nonfocal, moving all four extremities  Skin: No lesions  Access: IJ PermCath    Basic Metabolic Panel:  Recent Labs Lab 02/10/16 0500 02/12/16 1243 02/16/16 1301  NA 133* 135 138  K 4.9 5.4* 5.8*  CL 97* 99* 101  CO2 26 24 26   GLUCOSE 115* 146* 115*  BUN 68* 76* 59*  CREATININE 7.07* 7.32* 7.12*  CALCIUM 9.1 9.3 8.8*  PHOS 4.8* 5.5* 5.6*    Liver Function Tests:  Recent Labs Lab 02/10/16 0500 02/12/16 1243 02/16/16 1301  ALBUMIN 2.6* 2.8* 2.9*   No results for input(s): LIPASE, AMYLASE in the last 168 hours. No results for input(s): AMMONIA in the last 168 hours.  CBC:  Recent Labs Lab 02/10/16 0724 02/12/16 1243 02/16/16 1301  WBC 15.4* 13.7* 16.4*  HGB 8.5* 9.1* 9.3*  HCT 27.3* 29.6* 30.8*  MCV 84.0 85.1 87.5  PLT 153 158 223    Cardiac Enzymes: No results for input(s): CKTOTAL, CKMB, CKMBINDEX, TROPONINI in the last 168 hours.  BNP: Invalid input(s): POCBNP  CBG: No results for input(s): GLUCAP in the last 168 hours.  Microbiology: Results for orders placed or performed during the hospital encounter of 02/04/16  Culture, blood (routine x 2)     Status: None   Collection Time: 02/10/16  8:40 AM  Result Value Ref Range Status   Specimen Description HEMODIALYSIS LINE  Final   Special Requests BOTTLES DRAWN AEROBIC AND ANAEROBIC 5CC  Final   Culture NO GROWTH 5 DAYS   Final   Report Status 02/15/2016 FINAL  Final  Culture, blood (routine x 2)     Status: None   Collection Time: 02/10/16 10:47 AM  Result Value Ref Range Status   Specimen Description BLOOD LEFT ARM  Final   Special Requests BOTTLES DRAWN AEROBIC AND ANAEROBIC 5CC  Final   Culture NO GROWTH 5 DAYS  Final   Report Status 02/15/2016 FINAL  Final    Coagulation Studies: No results for input(s): LABPROT, INR in the last 72 hours.  Urinalysis: No results for input(s): COLORURINE, LABSPEC, PHURINE, GLUCOSEU, HGBUR, BILIRUBINUR, KETONESUR, PROTEINUR, UROBILINOGEN, NITRITE, LEUKOCYTESUR in the last 72 hours.  Invalid input(s): APPERANCEUR    Imaging: No results found.   Medications:       Assessment/ Plan:  80 y.o. male  with medical problems of and stated disease and hemodialysis for 3 years,chronic hypotension requiring midodrine, previous DVTs, history of GI bleed [facility]  1. End stage renal disease on dialysis TTS:  Patient seen and evaluated during hemodialysis. We plan to complete dialysis today. Thereafter the patient's next dialysis treatment will be on Saturday.  2. Anemia of chronic kidney disease:  Hemoglobin up to 9.3. Continue Aranesp 30 mcg subcutaneous weekly.  3. Secondary hyperparathyroidism: phosphorus was 5.6 today. Recheck on Saturday. Target phosphorus 5.5 or  less.  4. Chronic hypotension:  Blood pressure 94/60. Has history of chronic hypotension. Continue midodrine 10 mg by mouth 3 times a day.  5. Pericardial effusion per CT scan from outside hospital: management per hospitalist.    LOS: 0 Lavonne Cass 11/22/20173:58 PM

## 2016-02-18 NOTE — Progress Notes (Signed)
[facility]  ROUNDING NOTE   Subjective:  Patient resting comfortably in bed. He is due for hemodialysis again tomorrow.   Objective:  Vital signs in last 24 hours:  Temperature 97.1 pulse 97 respirations 20 blood pressure 128/70  Physical Exam: General: Chronically ill-appearing  Head: Temporal wasting  Eyes: Anicteric  Neck: Supple, trachea midline  Lungs:  Clear to auscultation, normal effort  Heart: S1S2 irregular  Abdomen:  Soft, nontender, bowel sounds present  Extremities: No peripheral edema.  Neurologic: Nonfocal, moving all four extremities  Skin: No lesions  Access: IJ PermCath    Basic Metabolic Panel:  Recent Labs Lab 02/12/16 1243 02/16/16 1301  NA 135 138  K 5.4* 5.8*  CL 99* 101  CO2 24 26  GLUCOSE 146* 115*  BUN 76* 59*  CREATININE 7.32* 7.12*  CALCIUM 9.3 8.8*  PHOS 5.5* 5.6*    Liver Function Tests:  Recent Labs Lab 02/12/16 1243 02/16/16 1301  ALBUMIN 2.8* 2.9*   No results for input(s): LIPASE, AMYLASE in the last 168 hours. No results for input(s): AMMONIA in the last 168 hours.  CBC:  Recent Labs Lab 02/12/16 1243 02/16/16 1301  WBC 13.7* 16.4*  HGB 9.1* 9.3*  HCT 29.6* 30.8*  MCV 85.1 87.5  PLT 158 223    Cardiac Enzymes: No results for input(s): CKTOTAL, CKMB, CKMBINDEX, TROPONINI in the last 168 hours.  BNP: Invalid input(s): POCBNP  CBG: No results for input(s): GLUCAP in the last 168 hours.  Microbiology: Results for orders placed or performed during the hospital encounter of 02/04/16  Culture, blood (routine x 2)     Status: None   Collection Time: 02/10/16  8:40 AM  Result Value Ref Range Status   Specimen Description HEMODIALYSIS LINE  Final   Special Requests BOTTLES DRAWN AEROBIC AND ANAEROBIC 5CC  Final   Culture NO GROWTH 5 DAYS  Final   Report Status 02/15/2016 FINAL  Final  Culture, blood (routine x 2)     Status: None   Collection Time: 02/10/16 10:47 AM  Result Value Ref Range  Status   Specimen Description BLOOD LEFT ARM  Final   Special Requests BOTTLES DRAWN AEROBIC AND ANAEROBIC 5CC  Final   Culture NO GROWTH 5 DAYS  Final   Report Status 02/15/2016 FINAL  Final    Coagulation Studies: No results for input(s): LABPROT, INR in the last 72 hours.  Urinalysis: No results for input(s): COLORURINE, LABSPEC, PHURINE, GLUCOSEU, HGBUR, BILIRUBINUR, KETONESUR, PROTEINUR, UROBILINOGEN, NITRITE, LEUKOCYTESUR in the last 72 hours.  Invalid input(s): APPERANCEUR    Imaging: No results found.   Medications:       Assessment/ Plan:  80 y.o. male  with medical problems of and stated disease and hemodialysis for 3 years,chronic hypotension requiring midodrine, previous DVTs, history of GI bleed [facility]  1. End stage renal disease on dialysis TTS:  We will plan for hemodialysis again tomorrow and place him back on a TTS schedule.  2. Anemia of chronic kidney disease:  Followup CBC tomorrow. Continue low-dose Aranesp for anemia of chronic kidney disease.  3. Secondary hyperparathyroidism: Recheck phosphorous prior to dialysis tomorrow.  4. Chronic hypotension:  Blood pressure better today at 128/70.Marland Kitchen. Has history of chronic hypotension. Continue midodrine 10 mg by mouth 3 times a day.  5. Pericardial effusion per CT scan from outside hospital: management per hospitalist.  LOS: 0 [facility] 11/24/201711:44 AM

## 2016-02-19 LAB — CBC
HEMATOCRIT: 32 % — AB (ref 39.0–52.0)
HEMOGLOBIN: 9.8 g/dL — AB (ref 13.0–17.0)
MCH: 27.5 pg (ref 26.0–34.0)
MCHC: 30.6 g/dL (ref 30.0–36.0)
MCV: 89.6 fL (ref 78.0–100.0)
Platelets: 163 10*3/uL (ref 150–400)
RBC: 3.57 MIL/uL — ABNORMAL LOW (ref 4.22–5.81)
RDW: 20.9 % — AB (ref 11.5–15.5)
WBC: 17.2 10*3/uL — AB (ref 4.0–10.5)

## 2016-02-19 LAB — RENAL FUNCTION PANEL
ANION GAP: 13 (ref 5–15)
Albumin: 3.1 g/dL — ABNORMAL LOW (ref 3.5–5.0)
BUN: 88 mg/dL — AB (ref 6–20)
CHLORIDE: 102 mmol/L (ref 101–111)
CO2: 25 mmol/L (ref 22–32)
Calcium: 8.9 mg/dL (ref 8.9–10.3)
Creatinine, Ser: 9.1 mg/dL — ABNORMAL HIGH (ref 0.61–1.24)
GFR calc Af Amer: 6 mL/min — ABNORMAL LOW (ref >60)
GFR, EST NON AFRICAN AMERICAN: 5 mL/min — AB (ref >60)
Glucose, Bld: 134 mg/dL — ABNORMAL HIGH (ref 65–99)
PHOSPHORUS: 6.7 mg/dL — AB (ref 2.5–4.6)
POTASSIUM: 5.6 mmol/L — AB (ref 3.5–5.1)
Sodium: 140 mmol/L (ref 135–145)

## 2016-02-20 ENCOUNTER — Other Ambulatory Visit (HOSPITAL_COMMUNITY): Payer: Self-pay

## 2016-02-21 NOTE — Progress Notes (Signed)
[facility]  ROUNDING NOTE   Subjective:  Patient resting comfortably in bed. He is due for hemodialysis again tomorrow.   Objective:  Vital signs in last 24 hours:  Temperature  98.3, pulse 100, respirations 20, blood pressure 129/74  Physical Exam: General: Chronically ill-appearing  Head: Temporal wasting  Eyes: Anicteric  Neck: Supple, trachea midline  Lungs:  Clear to auscultation, normal effort  Heart: S1S2 irregular  Abdomen:  Soft, nontender, bowel sounds present  Extremities: No peripheral edema.  Neurologic: Nonfocal, moving all four extremities  Skin: No lesions  Access: IJ PermCath    Basic Metabolic Panel:  Recent Labs Lab 02/16/16 1301 02/19/16 2200  NA 138 140  K 5.8* 5.6*  CL 101 102  CO2 26 25  GLUCOSE 115* 134*  BUN 59* 88*  CREATININE 7.12* 9.10*  CALCIUM 8.8* 8.9  PHOS 5.6* 6.7*    Liver Function Tests:  Recent Labs Lab 02/16/16 1301 02/19/16 2200  ALBUMIN 2.9* 3.1*   No results for input(s): LIPASE, AMYLASE in the last 168 hours. No results for input(s): AMMONIA in the last 168 hours.  CBC:  Recent Labs Lab 02/16/16 1301 02/19/16 2200  WBC 16.4* 17.2*  HGB 9.3* 9.8*  HCT 30.8* 32.0*  MCV 87.5 89.6  PLT 223 163    Cardiac Enzymes: No results for input(s): CKTOTAL, CKMB, CKMBINDEX, TROPONINI in the last 168 hours.  BNP: Invalid input(s): POCBNP  CBG: No results for input(s): GLUCAP in the last 168 hours.  Microbiology: Results for orders placed or performed during the hospital encounter of 02/04/16  Culture, blood (routine x 2)     Status: None   Collection Time: 02/10/16  8:40 AM  Result Value Ref Range Status   Specimen Description HEMODIALYSIS LINE  Final   Special Requests BOTTLES DRAWN AEROBIC AND ANAEROBIC 5CC  Final   Culture NO GROWTH 5 DAYS  Final   Report Status 02/15/2016 FINAL  Final  Culture, blood (routine x 2)     Status: None   Collection Time: 02/10/16 10:47 AM  Result Value Ref  Range Status   Specimen Description BLOOD LEFT ARM  Final   Special Requests BOTTLES DRAWN AEROBIC AND ANAEROBIC 5CC  Final   Culture NO GROWTH 5 DAYS  Final   Report Status 02/15/2016 FINAL  Final    Coagulation Studies: No results for input(s): LABPROT, INR in the last 72 hours.  Urinalysis: No results for input(s): COLORURINE, LABSPEC, PHURINE, GLUCOSEU, HGBUR, BILIRUBINUR, KETONESUR, PROTEINUR, UROBILINOGEN, NITRITE, LEUKOCYTESUR in the last 72 hours.  Invalid input(s): APPERANCEUR    Imaging: Dg Chest Port 1 View  Result Date: 02/20/2016 CLINICAL DATA:  Respiratory failure EXAM: PORTABLE CHEST 1 VIEW COMPARISON:  02/04/2016 FINDINGS: Cardiac shadow remains enlarged. Dialysis catheter is again noted. Bilateral airspace opacities are noted consistent with pulmonary edema. Small right-sided pleural effusion is noted. No bony abnormality is seen. IMPRESSION: Changes consistent with CHF and right-sided pleural effusion. Electronically Signed   By: Alcide CleverMark  Lukens M.D.   On: 02/20/2016 09:34     Medications:       Assessment/ Plan:  80 y.o. male  with medical problems of and stated disease and hemodialysis for 3 years,chronic hypotension requiring midodrine, previous DVTs, history of GI bleed [facility]  1. End stage renal disease on dialysis TTS:   We will plan for hemodialysis again tomorrow and  place him back on a TTS schedule.  2. Anemia of chronic kidney disease:   -  Continue low-dose Aranesp for anemia of chronic kidney disease. - currently 30 mcg SQ weekly  3. Secondary hyperparathyroidism: Recheck phosphorous prior to dialysis tomorrow. - continue sevelamer 2400 mg TID - Phos high  4. Chronic hypotension:    Continue midodrine 10 mg by mouth 3 times a day.  5. Pericardial effusion per CT scan from outside hospital:  management per hospitalist.    LOS: 0 Happy Begeman 11/27/20174:15 PM

## 2016-02-22 LAB — CBC
HCT: 19.4 % — ABNORMAL LOW (ref 39.0–52.0)
Hemoglobin: 6 g/dL — CL (ref 13.0–17.0)
MCH: 27.5 pg (ref 26.0–34.0)
MCHC: 30.9 g/dL (ref 30.0–36.0)
MCV: 89 fL (ref 78.0–100.0)
PLATELETS: 195 10*3/uL (ref 150–400)
RBC: 2.18 MIL/uL — ABNORMAL LOW (ref 4.22–5.81)
RDW: 20.5 % — AB (ref 11.5–15.5)
WBC: 13.6 10*3/uL — ABNORMAL HIGH (ref 4.0–10.5)

## 2016-02-22 LAB — ABO/RH: ABO/RH(D): O POS

## 2016-02-22 LAB — PREPARE RBC (CROSSMATCH)

## 2016-02-23 LAB — TYPE AND SCREEN
ABO/RH(D): O POS
ANTIBODY SCREEN: NEGATIVE
Unit division: 0

## 2016-02-23 NOTE — Progress Notes (Signed)
[facility]  ROUNDING NOTE   Subjective:  Patient resting comfortably in bed. He is due for hemodialysis again tomorrow. No c/o Cup of ice chips in hand  Objective:  Vital signs in last 24 hours:  Temperature  97.2, pulse 80, respirations 20, blood pressure 162/84  Physical Exam: General: Chronically ill-appearing  Head: Temporal wasting  Eyes: Anicteric  Neck: Supple, trachea midline  Lungs:  Clear to auscultation, normal effort  Heart: S1S2 irregular  Abdomen:  Soft, nontender, bowel sounds present  Extremities: No peripheral edema.  Neurologic: Nonfocal, moving all four extremities  Skin: No lesions  Access: IJ PermCath    Basic Metabolic Panel:  Recent Labs Lab 02/16/16 1301 02/19/16 2200  NA 138 140  K 5.8* 5.6*  CL 101 102  CO2 26 25  GLUCOSE 115* 134*  BUN 59* 88*  CREATININE 7.12* 9.10*  CALCIUM 8.8* 8.9  PHOS 5.6* 6.7*    Liver Function Tests:  Recent Labs Lab 02/16/16 1301 02/19/16 2200  ALBUMIN 2.9* 3.1*   No results for input(s): LIPASE, AMYLASE in the last 168 hours. No results for input(s): AMMONIA in the last 168 hours.  CBC:  Recent Labs Lab 02/16/16 1301 02/19/16 2200 02/22/16 1438  WBC 16.4* 17.2* 13.6*  HGB 9.3* 9.8* 6.0*  HCT 30.8* 32.0* 19.4*  MCV 87.5 89.6 89.0  PLT 223 163 195    Cardiac Enzymes: No results for input(s): CKTOTAL, CKMB, CKMBINDEX, TROPONINI in the last 168 hours.  BNP: Invalid input(s): POCBNP  CBG: No results for input(s): GLUCAP in the last 168 hours.  Microbiology: Results for orders placed or performed during the hospital encounter of 02/04/16  Culture, blood (routine x 2)     Status: None   Collection Time: 02/10/16  8:40 AM  Result Value Ref Range Status   Specimen Description HEMODIALYSIS LINE  Final   Special Requests BOTTLES DRAWN AEROBIC AND ANAEROBIC 5CC  Final   Culture NO GROWTH 5 DAYS  Final   Report Status 02/15/2016 FINAL  Final  Culture, blood (routine x 2)      Status: None   Collection Time: 02/10/16 10:47 AM  Result Value Ref Range Status   Specimen Description BLOOD LEFT ARM  Final   Special Requests BOTTLES DRAWN AEROBIC AND ANAEROBIC 5CC  Final   Culture NO GROWTH 5 DAYS  Final   Report Status 02/15/2016 FINAL  Final    Coagulation Studies: No results for input(s): LABPROT, INR in the last 72 hours.  Urinalysis: No results for input(s): COLORURINE, LABSPEC, PHURINE, GLUCOSEU, HGBUR, BILIRUBINUR, KETONESUR, PROTEINUR, UROBILINOGEN, NITRITE, LEUKOCYTESUR in the last 72 hours.  Invalid input(s): APPERANCEUR    Imaging: No results found.   Medications:       Assessment/ Plan:  80 y.o. male  with medical problems of and stated disease and hemodialysis for 3 years,chronic hypotension requiring midodrine, previous DVTs, history of GI bleed was [facility]  1. End stage renal disease on dialysis TTS:   continue TTS schedule.  2. Anemia of chronic kidney disease:   -  Continue low-dose Aranesp for anemia of chronic kidney disease. - currently 30 mcg SQ weekly - monitor closely - blood transfusion 11/28  3. Secondary hyperparathyroidism: Recheck phosphorous prior to dialysis tomorrow. - continue sevelamer 2400 mg TID - Phos high  4. Chronic hypotension:    Continue midodrine 10 mg by mouth  3 times a day.  5. Pericardial effusion per CT scan from outside hospital: management per hospitalist.    LOS: 0 Jencarlo Bonadonna 11/29/20179:23 AM

## 2016-02-24 LAB — RENAL FUNCTION PANEL
ANION GAP: 12 (ref 5–15)
Albumin: 2.9 g/dL — ABNORMAL LOW (ref 3.5–5.0)
BUN: 84 mg/dL — ABNORMAL HIGH (ref 6–20)
CALCIUM: 9 mg/dL (ref 8.9–10.3)
CHLORIDE: 101 mmol/L (ref 101–111)
CO2: 24 mmol/L (ref 22–32)
CREATININE: 8.46 mg/dL — AB (ref 0.61–1.24)
GFR, EST AFRICAN AMERICAN: 6 mL/min — AB (ref >60)
GFR, EST NON AFRICAN AMERICAN: 5 mL/min — AB (ref >60)
Glucose, Bld: 144 mg/dL — ABNORMAL HIGH (ref 65–99)
Phosphorus: 7.2 mg/dL — ABNORMAL HIGH (ref 2.5–4.6)
Potassium: 5.7 mmol/L — ABNORMAL HIGH (ref 3.5–5.1)
SODIUM: 137 mmol/L (ref 135–145)

## 2016-02-24 LAB — CBC
HCT: 30.7 % — ABNORMAL LOW (ref 39.0–52.0)
HEMOGLOBIN: 9.7 g/dL — AB (ref 13.0–17.0)
MCH: 27.4 pg (ref 26.0–34.0)
MCHC: 31.6 g/dL (ref 30.0–36.0)
MCV: 86.7 fL (ref 78.0–100.0)
Platelets: 122 10*3/uL — ABNORMAL LOW (ref 150–400)
RBC: 3.54 MIL/uL — AB (ref 4.22–5.81)
RDW: 19.6 % — ABNORMAL HIGH (ref 11.5–15.5)
WBC: 13.5 10*3/uL — AB (ref 4.0–10.5)

## 2016-02-24 LAB — PROCALCITONIN: Procalcitonin: 1.69 ng/mL

## 2016-02-25 NOTE — Progress Notes (Signed)
[facility]  ROUNDING NOTE   Subjective:  Patient resting comfortably in bed. He is due for hemodialysis again tomorrow. No c/o  denies SOB  Objective:  Vital signs in last 24 hours:  Temperature  97.2, pulse 86, respirations 26, blood pressure 118/76  Physical Exam: General: Chronically ill-appearing  Head: Temporal wasting  Eyes: Anicteric  Neck: Supple, trachea midline  Lungs:  Clear to auscultation, normal effort  Heart: S1S2 irregular  Abdomen:  Soft, nontender, bowel sounds present  Extremities: No peripheral edema.  Neurologic: Nonfocal, moving all four extremities  Skin: No lesions  Access: IJ PermCath    Basic Metabolic Panel:  Recent Labs Lab 02/19/16 2200 02/24/16 1215  NA 140 137  K 5.6* 5.7*  CL 102 101  CO2 25 24  GLUCOSE 134* 144*  BUN 88* 84*  CREATININE 9.10* 8.46*  CALCIUM 8.9 9.0  PHOS 6.7* 7.2*    Liver Function Tests:  Recent Labs Lab 02/19/16 2200 02/24/16 1215  ALBUMIN 3.1* 2.9*   No results for input(s): LIPASE, AMYLASE in the last 168 hours. No results for input(s): AMMONIA in the last 168 hours.  CBC:  Recent Labs Lab 02/19/16 2200 02/22/16 1438 02/24/16 1215  WBC 17.2* 13.6* 13.5*  HGB 9.8* 6.0* 9.7*  HCT 32.0* 19.4* 30.7*  MCV 89.6 89.0 86.7  PLT 163 195 122*    Cardiac Enzymes: No results for input(s): CKTOTAL, CKMB, CKMBINDEX, TROPONINI in the last 168 hours.  BNP: Invalid input(s): POCBNP  CBG: No results for input(s): GLUCAP in the last 168 hours.  Microbiology: Results for orders placed or performed during the hospital encounter of 02/04/16  Culture, blood (routine x 2)     Status: None   Collection Time: 02/10/16  8:40 AM  Result Value Ref Range Status   Specimen Description HEMODIALYSIS LINE  Final   Special Requests BOTTLES DRAWN AEROBIC AND ANAEROBIC 5CC  Final   Culture NO GROWTH 5 DAYS  Final   Report Status 02/15/2016 FINAL  Final  Culture, blood (routine x 2)     Status: None    Collection Time: 02/10/16 10:47 AM  Result Value Ref Range Status   Specimen Description BLOOD LEFT ARM  Final   Special Requests BOTTLES DRAWN AEROBIC AND ANAEROBIC 5CC  Final   Culture NO GROWTH 5 DAYS  Final   Report Status 02/15/2016 FINAL  Final  Culture, blood (routine x 2)     Status: None (Preliminary result)   Collection Time: 02/22/16  1:10 PM  Result Value Ref Range Status   Specimen Description BLOOD HEMODIALYSIS CATHETER  Final   Special Requests BOTTLES DRAWN AEROBIC AND ANAEROBIC 10CC  Final   Culture NO GROWTH 2 DAYS  Final   Report Status PENDING  Incomplete  Culture, blood (routine x 2)     Status: None (Preliminary result)   Collection Time: 02/22/16  1:40 PM  Result Value Ref Range Status   Specimen Description BLOOD HEMODIALYSIS CATHETER  Final   Special Requests BOTTLES DRAWN AEROBIC AND ANAEROBIC 10CC  Final   Culture NO GROWTH 2 DAYS  Final   Report Status PENDING  Incomplete    Coagulation Studies: No results for input(s): LABPROT, INR in the last 72 hours.  Urinalysis: No results for input(s): COLORURINE, LABSPEC, PHURINE, GLUCOSEU, HGBUR, BILIRUBINUR, KETONESUR, PROTEINUR, UROBILINOGEN, NITRITE, LEUKOCYTESUR in the last 72 hours.  Invalid input(s): APPERANCEUR    Imaging: No results found.   Medications:       Assessment/ Plan:  80 y.o. male  with medical problems of and stated disease and hemodialysis for 3 years,chronic hypotension requiring midodrine, previous DVTs, history of GI bleed [facility]  1. End stage renal disease on dialysis TTS:   continue TTS schedule.  2. Anemia of chronic kidney disease:   -  Continue low-dose Aranesp for anemia of chronic kidney disease. - currently 40 mcg SQ weekly - monitor closely - blood transfusion 11/28  3. Secondary hyperparathyroidism:  -  continue sevelamer 2400 mg TID - Phos high  4. Chronic hypotension:    Continue midodrine 10 mg by mouth 3 times a day.  5. Pericardial effusion per CT scan from outside hospital: management per hospitalist.    LOS: 0 [facility] 12/1/20179:33 AM

## 2016-02-26 LAB — RENAL FUNCTION PANEL
ALBUMIN: 3 g/dL — AB (ref 3.5–5.0)
Anion gap: 12 (ref 5–15)
BUN: 93 mg/dL — ABNORMAL HIGH (ref 6–20)
CALCIUM: 9.2 mg/dL (ref 8.9–10.3)
CHLORIDE: 104 mmol/L (ref 101–111)
CO2: 24 mmol/L (ref 22–32)
CREATININE: 9.25 mg/dL — AB (ref 0.61–1.24)
GFR, EST AFRICAN AMERICAN: 6 mL/min — AB (ref >60)
GFR, EST NON AFRICAN AMERICAN: 5 mL/min — AB (ref >60)
GLUCOSE: 82 mg/dL (ref 65–99)
POTASSIUM: 5.9 mmol/L — AB (ref 3.5–5.1)
Phosphorus: 7.3 mg/dL — ABNORMAL HIGH (ref 2.5–4.6)
Sodium: 140 mmol/L (ref 135–145)

## 2016-02-26 LAB — PROCALCITONIN: PROCALCITONIN: 1.05 ng/mL

## 2016-02-26 LAB — CBC
HEMATOCRIT: 31.2 % — AB (ref 39.0–52.0)
HEMOGLOBIN: 9.7 g/dL — AB (ref 13.0–17.0)
MCH: 26.9 pg (ref 26.0–34.0)
MCHC: 31.1 g/dL (ref 30.0–36.0)
MCV: 86.7 fL (ref 78.0–100.0)
Platelets: 96 10*3/uL — ABNORMAL LOW (ref 150–400)
RBC: 3.6 MIL/uL — AB (ref 4.22–5.81)
RDW: 18.6 % — AB (ref 11.5–15.5)
WBC: 11.8 10*3/uL — AB (ref 4.0–10.5)

## 2016-02-27 LAB — CULTURE, BLOOD (ROUTINE X 2)
CULTURE: NO GROWTH
Culture: NO GROWTH

## 2016-02-28 NOTE — Progress Notes (Signed)
[facility]  ROUNDING NOTE   Subjective:  Patient resting comfortably in bed. He is due for hemodialysis again tomorrow. No c/o  denies SOB   Objective:  Vital signs in last 24 hours:     Physical Exam: General: Chronically ill-appearing  Head: Temporal wasting  Eyes: Anicteric  Neck: Supple, trachea midline  Lungs:  Clear to auscultation, normal effort  Heart: S1S2 irregular  Abdomen:  Soft, nontender, bowel sounds present  Extremities: No peripheral edema.  Neurologic: Nonfocal, moving all four extremities  Skin: No lesions  Access: IJ PermCath    Basic Metabolic Panel:  Recent Labs Lab 02/24/16 1215 02/26/16 0730  NA 137 140  K 5.7* 5.9*  CL 101 104  CO2 24 24  GLUCOSE 144* 82  BUN 84* 93*  CREATININE 8.46* 9.25*  CALCIUM 9.0 9.2  PHOS 7.2* 7.3*    Liver Function Tests:  Recent Labs Lab 02/24/16 1215 02/26/16 0730  ALBUMIN 2.9* 3.0*   No results for input(s): LIPASE, AMYLASE in the last 168 hours. No results for input(s): AMMONIA in the last 168 hours.  CBC:  Recent Labs Lab 02/22/16 1438 02/24/16 1215 02/26/16 0730  WBC 13.6* 13.5* 11.8*  HGB 6.0* 9.7* 9.7*  HCT 19.4* 30.7* 31.2*  MCV 89.0 86.7 86.7  PLT 195 122* 96*    Cardiac Enzymes: No results for input(s): CKTOTAL, CKMB, CKMBINDEX, TROPONINI in the last 168 hours.  BNP: Invalid input(s): POCBNP  CBG: No results for input(s): GLUCAP in the last 168 hours.  Microbiology: Results for orders placed or performed during the hospital encounter of 02/04/16  Culture, blood (routine x 2)     Status: None   Collection Time: 02/10/16  8:40 AM  Result Value Ref Range Status   Specimen Description HEMODIALYSIS LINE  Final   Special Requests BOTTLES DRAWN AEROBIC AND ANAEROBIC 5CC  Final   Culture NO GROWTH 5 DAYS  Final   Report Status 02/15/2016 FINAL  Final  Culture, blood (routine x 2)     Status: None   Collection Time: 02/10/16 10:47 AM  Result Value Ref Range  Status   Specimen Description BLOOD LEFT ARM  Final   Special Requests BOTTLES DRAWN AEROBIC AND ANAEROBIC 5CC  Final   Culture NO GROWTH 5 DAYS  Final   Report Status 02/15/2016 FINAL  Final  Culture, blood (routine x 2)     Status: None   Collection Time: 02/22/16  1:10 PM  Result Value Ref Range Status   Specimen Description BLOOD HEMODIALYSIS CATHETER  Final   Special Requests BOTTLES DRAWN AEROBIC AND ANAEROBIC 10CC  Final   Culture NO GROWTH 5 DAYS  Final   Report Status 02/27/2016 FINAL  Final  Culture, blood (routine x 2)     Status: None   Collection Time: 02/22/16  1:40 PM  Result Value Ref Range Status   Specimen Description BLOOD HEMODIALYSIS CATHETER  Final   Special Requests BOTTLES DRAWN AEROBIC AND ANAEROBIC 10CC  Final   Culture NO GROWTH 5 DAYS  Final   Report Status 02/27/2016 FINAL  Final    Coagulation Studies: No results for input(s): LABPROT, INR in the last 72 hours.  Urinalysis: No results for input(s): COLORURINE, LABSPEC, PHURINE, GLUCOSEU, HGBUR, BILIRUBINUR, KETONESUR, PROTEINUR, UROBILINOGEN, NITRITE, LEUKOCYTESUR in the last 72 hours.  Invalid input(s): APPERANCEUR    Imaging: No results found.   Medications:       Assessment/ Plan:  80 y.o. male  with medical problems of and  stated disease and hemodialysis for 3 years,chronic hypotension requiring midodrine, previous DVTs, history of GI bleed [facility]  1. End stage renal disease on dialysis TTS:   continue TTS schedule.  2. Anemia of chronic kidney disease:   -  Continue low-dose Aranesp for anemia of chronic kidney disease. - currently 40 mcg SQ weekly - monitor closely - blood transfusion 11/28  3. Secondary hyperparathyroidism:  - continue sevelamer 2400 mg TID - Phos high - Low phos diet  4. Chronic hypotension:    Continue  midodrine 10 mg by mouth 3 times a day.  5. Pericardial effusion per CT scan from outside hospital: management per hospitalist.    LOS: 0 Gwendlyn Hanback 12/4/201711:27 AM

## 2016-10-25 DEATH — deceased

## 2018-09-29 IMAGING — DX DG CHEST 1V PORT
1 series · 1 of 1 positions shown · non-contrast
Comparison: May 13, 2004

CLINICAL DATA: Pulmonary edema.

EXAM:
PORTABLE CHEST 1 VIEW

[chest ap]
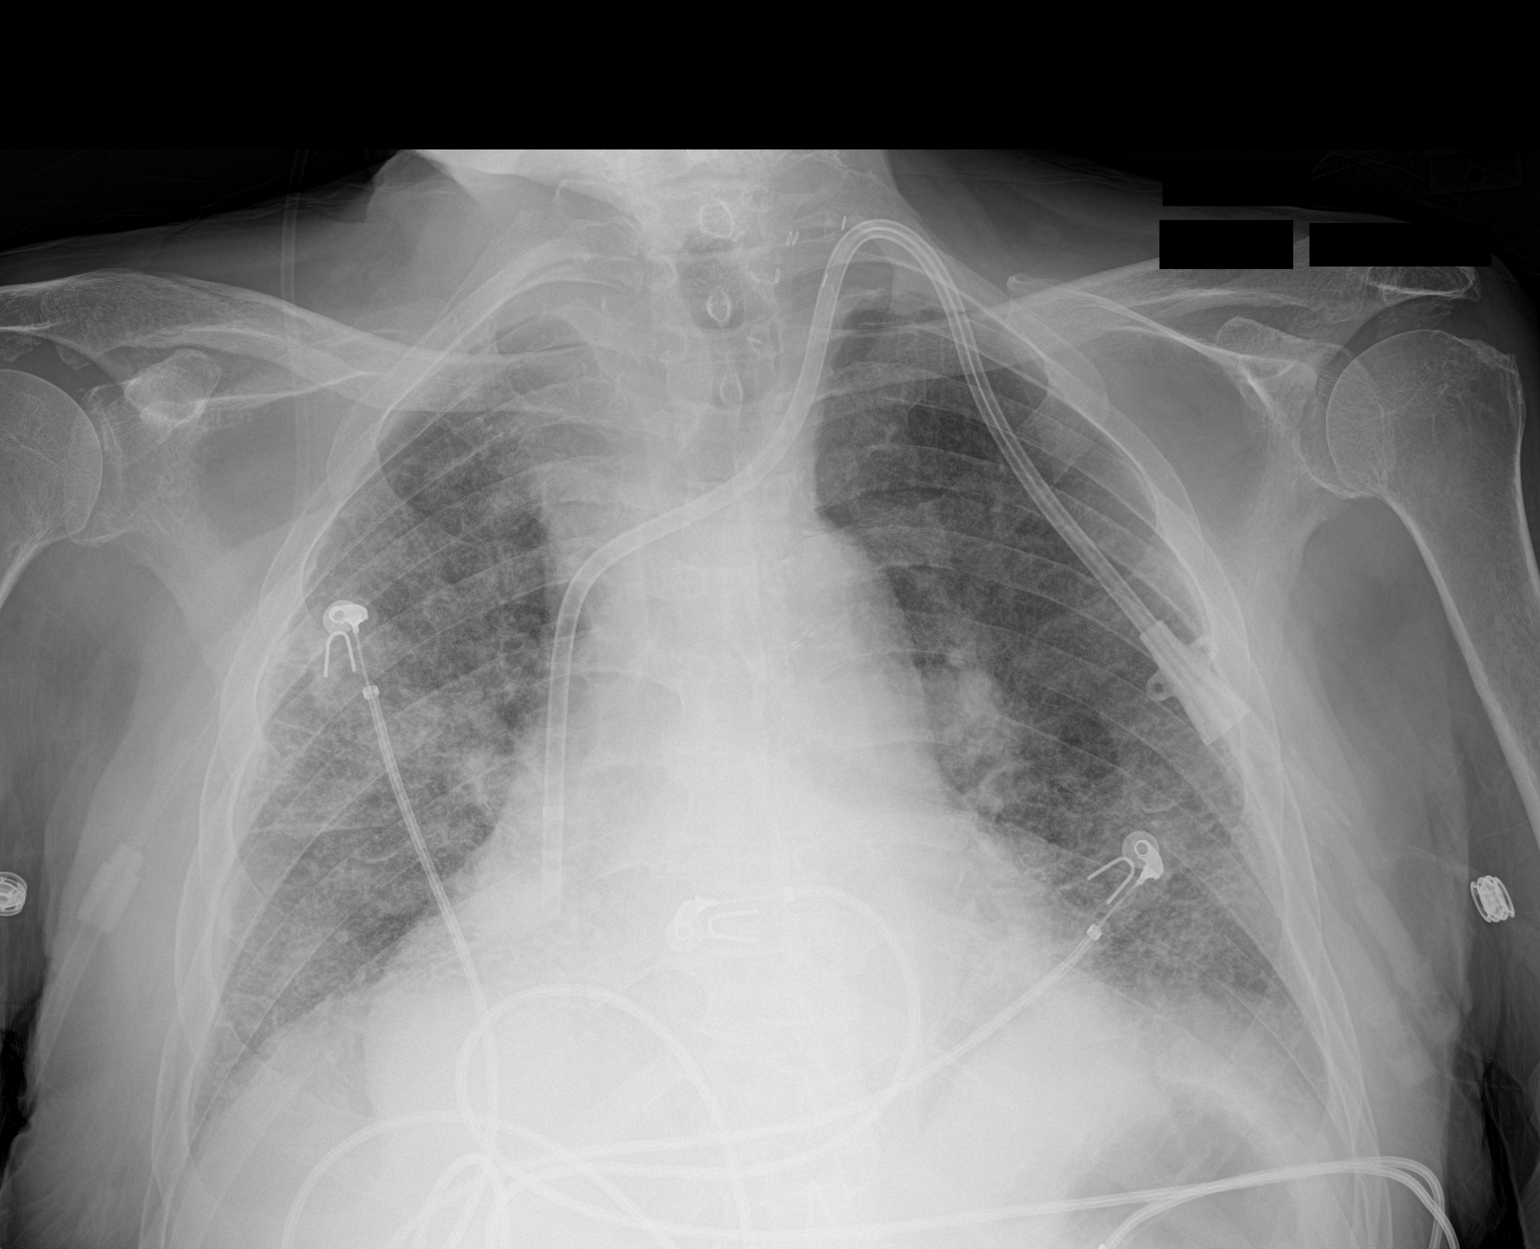

[1 of 1 positions shown; findings below may reference images not displayed]

FINDINGS: A left PermCath is identified in good position. Stable cardiomegaly.
The hila and mediastinum are unchanged. Increased interstitial
markings consistent with moderate pulmonary edema. No other acute
abnormalities.
IMPRESSION: Cardiomegaly and moderate pulmonary edema.
